# Patient Record
Sex: Male | Born: 1975 | Race: White | Hispanic: No | Smoking: Never smoker
Health system: Southern US, Community
[De-identification: ages and names within clinical notes are randomized; demographics above are authoritative.]

## PROBLEM LIST (undated history)

## (undated) DIAGNOSIS — L98499 Non-pressure chronic ulcer of skin of other sites with unspecified severity: Secondary | ICD-10-CM

---

## 2006-05-06 ENCOUNTER — Ambulatory Visit: Payer: Self-pay | Admitting: Internal Medicine

## 2008-10-09 ENCOUNTER — Ambulatory Visit: Payer: Self-pay

## 2008-11-03 ENCOUNTER — Ambulatory Visit: Payer: Self-pay

## 2009-01-30 ENCOUNTER — Ambulatory Visit (HOSPITAL_COMMUNITY): Admission: RE | Admit: 2009-01-30 | Discharge: 2009-01-30 | Payer: Self-pay | Admitting: Otolaryngology

## 2009-12-12 ENCOUNTER — Emergency Department: Payer: Self-pay | Admitting: Emergency Medicine

## 2010-01-06 ENCOUNTER — Encounter: Admission: RE | Admit: 2010-01-06 | Discharge: 2010-01-06 | Payer: Self-pay | Admitting: Occupational Medicine

## 2010-09-27 LAB — CBC
MCHC: 34.1 g/dL (ref 30.0–36.0)
Platelets: 259 10*3/uL (ref 150–400)
RDW: 14.2 % (ref 11.5–15.5)

## 2010-09-27 LAB — BASIC METABOLIC PANEL
BUN: 11 mg/dL (ref 6–23)
Calcium: 10.2 mg/dL (ref 8.4–10.5)
Chloride: 102 mEq/L (ref 96–112)
GFR calc Af Amer: 52 mL/min — ABNORMAL LOW (ref >60)
GFR calc non Af Amer: 43 mL/min — ABNORMAL LOW (ref >60)
Potassium: 4.4 mEq/L (ref 3.5–5.1)

## 2010-11-03 NOTE — Op Note (Signed)
Bryan Mccarthy, Bryan Mccarthy                ACCOUNT NO.:  [id number]   MEDICAL RECORD NO.:  [id number]          PATIENT TYPE:  OIB   LOCATION:  2550                         FACILITY:  [facility]   PHYSICIAN:  Jefry H. Pollyann Kennedy, MD     DATE OF BIRTH:  02-19-1976   DATE OF PROCEDURE:  01/30/2009  DATE OF DISCHARGE:                               OPERATIVE REPORT   PREOPERATIVE DIAGNOSES:  1. Nasal septal deviation.  2. Inferior turbinate hyperplasia.  3. Severe obstructive sleep apnea syndrome.   POSTOPERATIVE DIAGNOSES:  1. Nasal septal deviation.  2. Inferior turbinate hyperplasia.  3. Severe obstructive sleep apnea syndrome.   PROCEDURE:  1. Nasal septoplasty.  2. Submucous resection of inferior turbinates bilaterally.   SURGEON:  Jefry H. Pollyann Kennedy, MD.   ANESTHESIA:  General endotracheal anesthesia was used.   COMPLICATIONS:  No complications.   BLOOD LOSS:  Minimal.   FINDINGS:  Significant deviation of the septum with a large spur  posteriorly on the left side comprised of the vomerine bone and the  lower ethmoid plate.  The inferior turbinates were significantly  enlarged with a bony thickening and elongation.  No complications.   HISTORY:  A 35 year old with a history of severe obstructive sleep apnea  syndrome.  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  He has chronic nasal obstruction.  Risks,  benefits, alternatives, and complications of the procedure were  explained to the patient who seem to understand and agreed with surgery.   PROCEDURE IN DETAIL:  The patient was taken to the operating room and  placed in the operative table in supine position.  Following the  induction of general endotracheal anesthesia, table was positioned.  The  patient was draped in standard fashion.  Afrin spray was used  preoperatively in the nasal cavities.  Xylocaine 1% with epinephrine  was  infiltrated into the septum, columella, and inferior turbinates  bilaterally.  Afrin-soaked pledgets were placed bilaterally.  1. Nasal septoplasty.  A left hemitransfixion incision was performed      using a 15 scalpel and the mucoperichondrial flap was developed      posteriorly down to the left side of the septum.  The bony      cartilaginous junction was divided and a similar flap was developed      on the right side.  There was a tear on the left and a tear on the      right, but there were not in the same position.  The ethmoid plate      and vomerine bone were all resected.  The quadrangle of cartilage      was in good shape and good position.  There was a little spurring      as well on the left side from the maxillary crest.  This was      reduced using 4-mm osteotome.  These measures nicely improved the      positioning of the septum.  Septal incision was reapproximated with      a chromic suture.  These flaps were quilted with a plain gut.  2. Submucous resection of inferior turbinates bilaterally.  The      leading edge of the inferior turbinates was incised in a vertical      fashion with 15 scalpel.  Cottle elevator was used to elevate      mucosa off the bone in all directions and large fragments of bone      resected.  Turbinate remnants were outfractured with the Cottle      elevator.  Nasal cavities in the pharynx were suctioned of blood      and secretions, and the nasal cavities were packed with a rolled up      Telfa and coated with bacitracin.  The patient was then awakened,      extubated, and transferred to recovery in stable condition.      Jefry H. Pollyann Kennedy, MD  Electronically Signed     JHR/MEDQ  D:  01/30/2009  T:  01/30/2009  Job:  [id number]

## 2011-11-16 IMAGING — CR DG ABDOMEN 1V
1 series · 2 of 2 positions shown · non-contrast
Comparison: none

REASON FOR EXAM: abd pain, pt reports hx of ulcer - eval for free air
COMMENTS:

PROCEDURE:     DXR - DXR KIDNEY URETER BLADDER  - December 12, 2009  [DATE]
RESULT:     Comparisons:  None

[Series 1: view not recorded · 0.17mm/px · 2 of 2 slices shown]
[im 1/2]
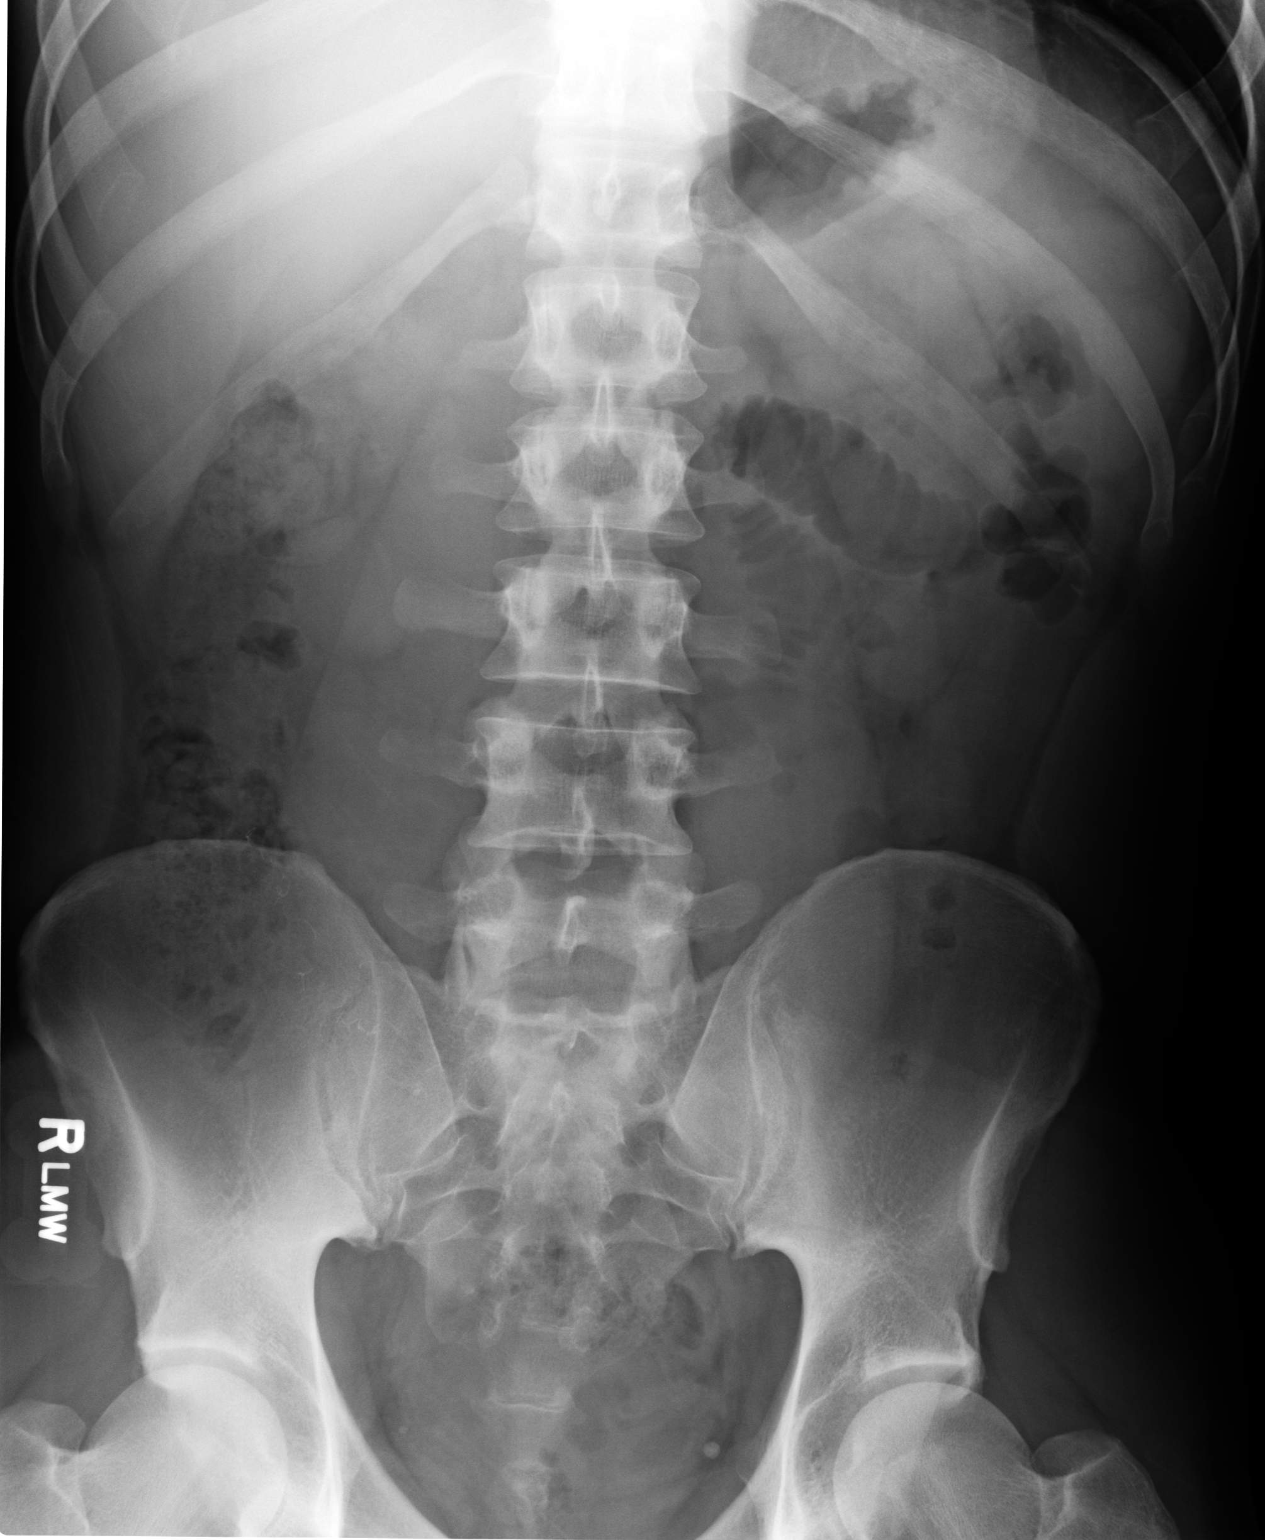
[im 2/2]
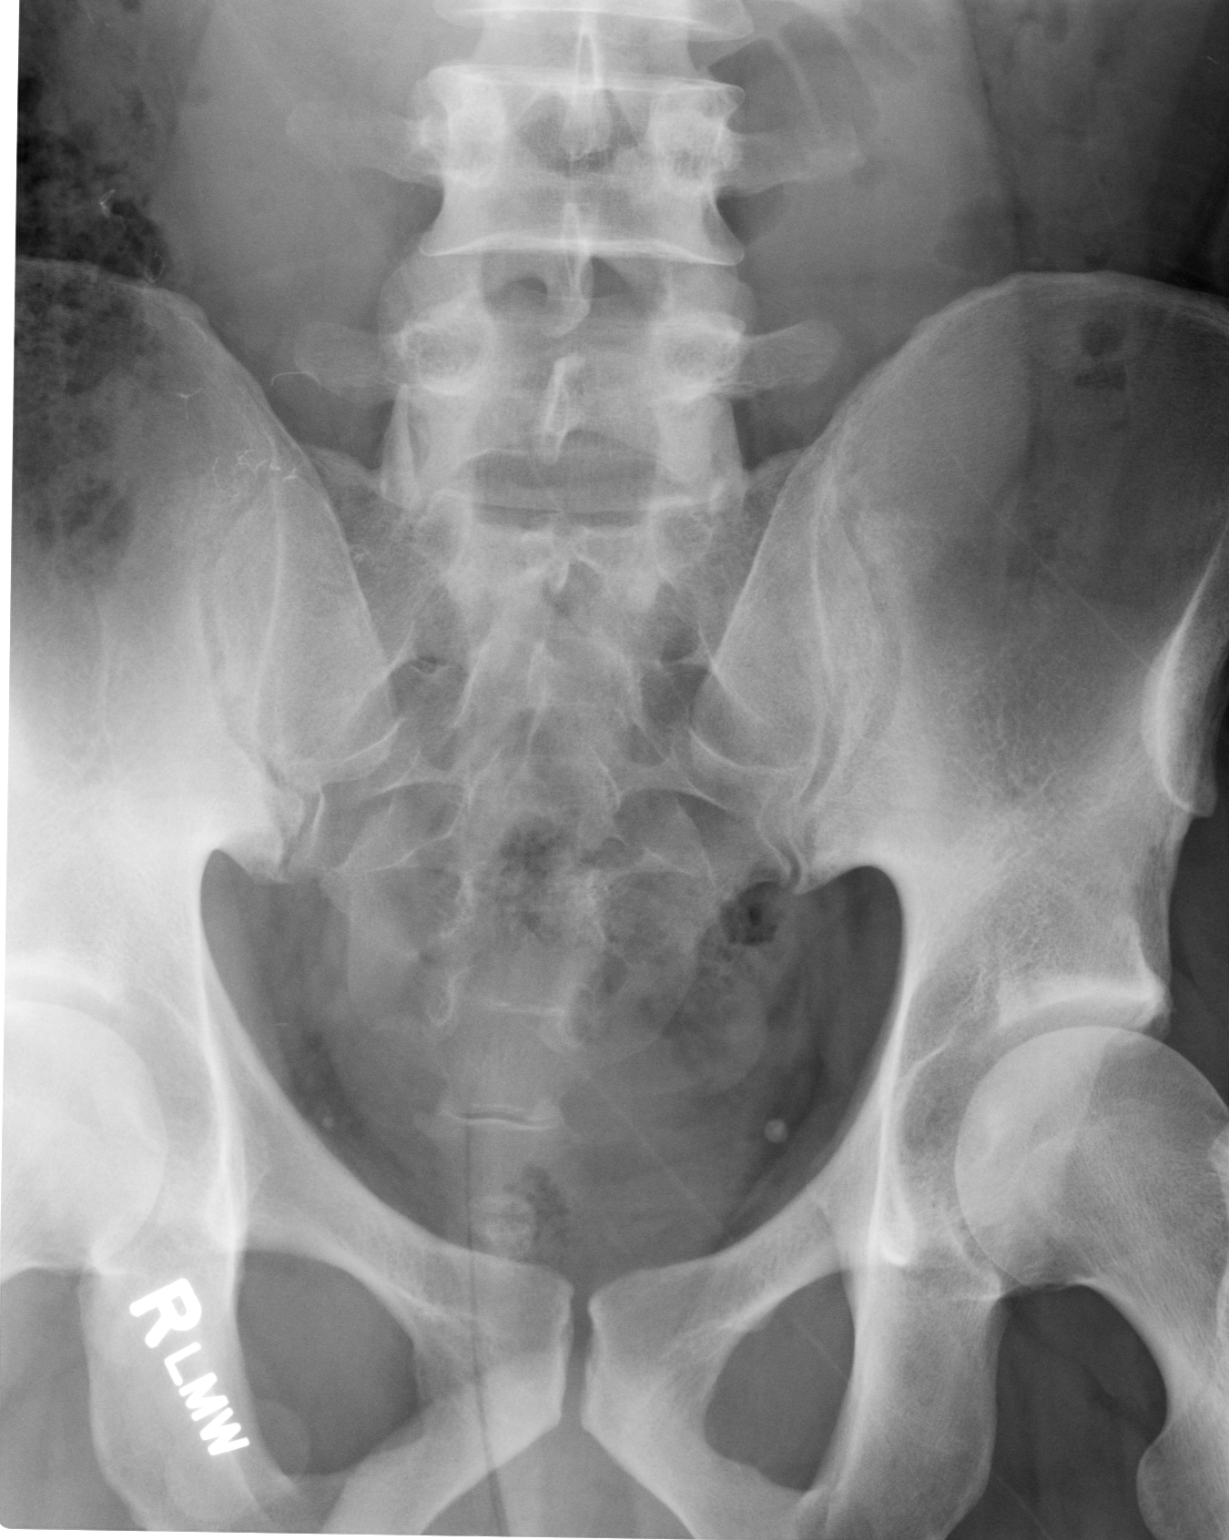

[2 of 2 positions shown; findings below may reference images not displayed]

FINDINGS: Supine radiograph of the abdomen is provided.

There is a nonspecific bowel gas pattern. There is no bowel dilatation to
suggest obstruction. There is no pathologic calcification along the expected
course of the ureters. There is no evidence of pneumoperitoneum, portal
venous gas, or pneumatosis.

The osseous structures are unremarkable.
IMPRESSION: Unremarkable KUB. If there is a concern regarding pneumoperitoneum an
upright and/or left lateral decubitus views recommended.

## 2017-03-07 ENCOUNTER — Emergency Department
Admission: EM | Admit: 2017-03-07 | Discharge: 2017-03-07 | Disposition: A | Discharge status: Home / Self Care | Payer: Self-pay | Attending: Emergency Medicine | Admitting: Emergency Medicine

## 2017-03-07 ENCOUNTER — Encounter: Payer: Self-pay | Admitting: Emergency Medicine

## 2017-03-07 DIAGNOSIS — K2900 Acute gastritis without bleeding: Secondary | ICD-10-CM | POA: Insufficient documentation

## 2017-03-07 HISTORY — DX: Non-pressure chronic ulcer of skin of other sites with unspecified severity: L98.499

## 2017-03-07 LAB — CBC
HCT: 45.6 % (ref 40.0–52.0)
Hemoglobin: 15.8 g/dL (ref 13.0–18.0)
MCH: 32.2 pg (ref 26.0–34.0)
MCHC: 34.6 g/dL (ref 32.0–36.0)
MCV: 93 fL (ref 80.0–100.0)
PLATELETS: 279 10*3/uL (ref 150–440)
RBC: 4.9 MIL/uL (ref 4.40–5.90)
RDW: 12.8 % (ref 11.5–14.5)
WBC: 19.1 10*3/uL — AB (ref 3.8–10.6)

## 2017-03-07 LAB — COMPREHENSIVE METABOLIC PANEL
ALK PHOS: 48 U/L (ref 38–126)
ALT: 43 U/L (ref 17–63)
AST: 36 U/L (ref 15–41)
Albumin: 4.7 g/dL (ref 3.5–5.0)
Anion gap: 6 (ref 5–15)
BILIRUBIN TOTAL: 1.3 mg/dL — AB (ref 0.3–1.2)
BUN: 14 mg/dL (ref 6–20)
CALCIUM: 10.1 mg/dL (ref 8.9–10.3)
CO2: 26 mmol/L (ref 22–32)
CREATININE: 1.15 mg/dL (ref 0.61–1.24)
Chloride: 105 mmol/L (ref 101–111)
GFR calc Af Amer: >60 mL/min (ref >60)
Glucose, Bld: 109 mg/dL — ABNORMAL HIGH (ref 65–99)
POTASSIUM: 4.4 mmol/L (ref 3.5–5.1)
Sodium: 137 mmol/L (ref 135–145)
TOTAL PROTEIN: 8 g/dL (ref 6.5–8.1)

## 2017-03-07 LAB — LIPASE, BLOOD: Lipase: 28 U/L (ref 11–51)

## 2017-03-07 MED ORDER — PANTOPRAZOLE SODIUM 20 MG PO TBEC: 20.0000 mg | DELAYED_RELEASE_TABLET | Freq: Every day | ORAL | 1 refills | Status: AC

## 2017-03-07 MED ORDER — GI COCKTAIL ~~LOC~~
30.0000 mL | Freq: Once | ORAL | Status: AC
  Administered 2017-03-07: 30 mL via ORAL
  Filled 2017-03-07: qty 30

## 2017-03-07 NOTE — ED Provider Notes (Signed)
[facility] Emergency Department Provider Note   ____________________________________________    I have reviewed the triage vital signs and the nursing notes.   HISTORY  Chief Complaint Abdominal Pain     HPI Bryan Mccarthy is a 41 y.o. male who presents with epigastric burning sensation for one day. Patient reports this pain is related to an ulcer that he has had for some time. He reports it always "flares up" when he is under stress, he reports he has been quite stressed this week. He also ate some "spicy "food last night. Denies nausea or vomiting. Describes a burning sensation in his epigastrium, his wife gave him Carafate which did help. He reports in the past he has had a GI cocktail in the emergency department which completely relieves his pain and that is what he would like.   Past Medical History:  Diagnosis Date  . Ulcer of abdomen wall (HCC)     There are no active problems to display for this patient.   History reviewed. No pertinent surgical history.  Prior to Admission medications   Medication Sig Start Date End Date Taking? Authorizing Provider  pantoprazole (PROTONIX) 20 MG tablet Take 1 tablet (20 mg total) by mouth daily. 03/07/17 03/07/18  Jene Every, MD     Allergies Percocet [oxycodone-acetaminophen]  History reviewed. No pertinent family history.  Social History Social History  Substance Use Topics  . Smoking status: Never Smoker  . Smokeless tobacco: Never Used  . Alcohol use No    Review of Systems  Constitutional: No fever/chills Eyes: No visual changes.  ENT: No sore throat. Cardiovascular: Denies chest pain. Respiratory: Denies shortness of breath. Gastrointestinal: As above  Genitourinary: Negative for dysuria. Musculoskeletal: Negative for back pain. Skin: Negative for rash. Neurological: Negative for headaches   ____________________________________________   PHYSICAL EXAM:  VITAL  SIGNS: ED Triage Vitals [03/07/17 1115]  Enc Vitals Group     BP 128/78     Pulse Rate 67     Resp 18     Temp 97.9 F (36.6 C)     Temp Source Oral     SpO2 100 %     Weight 69.9 kg (154 lb)     Height      Head Circumference      Peak Flow      Pain Score 10     Pain Loc      Pain Edu?      Excl. in GC?     Constitutional: Alert and oriented. No acute distress. Pleasant and interactive Eyes: Conjunctivae are normal.   Nose: No congestion/rhinnorhea. Mouth/Throat: Mucous membranes are moist.    Cardiovascular: Normal rate, regular rhythm.   Good peripheral circulation. Respiratory: Normal respiratory effort.  No retractions.  Gastrointestinal: Soft and nontender. No distention.  No CVA tenderness. Genitourinary: deferred Musculoskeletal: No lower extremity tenderness nor edema.  Warm and well perfused Neurologic:  Normal speech and language. No gross focal neurologic deficits are appreciated.  Skin:  Skin is warm, dry and intact. No rash noted. Psychiatric: Mood and affect are normal. Speech and behavior are normal.  ____________________________________________   LABS (all labs ordered are listed, but only abnormal results are displayed)  Labs Reviewed  COMPREHENSIVE METABOLIC PANEL - Abnormal; Notable for the following:       Result Value   Glucose, Bld 109 (*)    Total Bilirubin 1.3 (*)    All other components within normal limits  CBC -  Abnormal; Notable for the following:    WBC 19.1 (*)    All other components within normal limits  LIPASE, BLOOD  URINALYSIS, COMPLETE (UACMP) WITH MICROSCOPIC   ____________________________________________  EKG  None ____________________________________________  RADIOLOGY  None ____________________________________________   PROCEDURES  Procedure(s) performed: No    Critical Care performed: No ____________________________________________   INITIAL IMPRESSION / ASSESSMENT AND PLAN / ED COURSE  Pertinent  labs & imaging results that were available during my care of the patient were reviewed by me and considered in my medical decision making (see chart for details).  Patient well-appearing and in no acute distress. He is quite adamant that GI cocktail is what he needs. His lab work is significant only for an elevated white blood cell count of uncertain significance. His exam is certainly reassuring. He is well-appearing, vitals are completely normal, he has no tenderness to palpation. Given reassuring exam I will give him GI cocktail and reevaluate  Patient had complete resolution of discomfort after GI cocktail, he is anxious to leave. I will start him on PPIs and have him follow up closely with his PCP for further evaluation    ____________________________________________   FINAL CLINICAL IMPRESSION(S) / ED DIAGNOSES  Final diagnoses:  Acute gastritis without hemorrhage, unspecified gastritis type      NEW MEDICATIONS STARTED DURING THIS VISIT:  Discharge Medication List as of 03/07/2017  1:12 PM    START taking these medications   Details  pantoprazole (PROTONIX) 20 MG tablet Take 1 tablet (20 mg total) by mouth daily., Starting Mon 03/07/2017, Until Tue 03/07/2018, Print         Note:  This document was prepared using Dragon voice recognition software and may include unintentional dictation errors.    Jene Every, MD 03/07/17 [phone]

## 2017-03-07 NOTE — ED Triage Notes (Signed)
Pt to ed with c/o abd pain acute onset this am,  Hx of ulcer, reports this is the same pain.

## 2024-10-05 ENCOUNTER — Ambulatory Visit: Payer: Self-pay | Admitting: Nurse Practitioner
